# Patient Record
Sex: Female | Born: 1960 | Race: Black or African American | Hispanic: No | Marital: Married
Health system: Southern US, Community
[De-identification: ages and names within clinical notes are randomized; demographics above are authoritative.]

## PROBLEM LIST (undated history)

## (undated) DIAGNOSIS — I10 Essential (primary) hypertension: Secondary | ICD-10-CM

## (undated) HISTORY — PX: LAPAROSCOPIC GASTRIC SLEEVE RESECTION: SHX5895

---

## 2012-05-02 DIAGNOSIS — R739 Hyperglycemia, unspecified: Secondary | ICD-10-CM | POA: Insufficient documentation

## 2012-06-01 DIAGNOSIS — Z4651 Encounter for fitting and adjustment of gastric lap band: Secondary | ICD-10-CM | POA: Insufficient documentation

## 2014-03-27 DIAGNOSIS — I10 Essential (primary) hypertension: Secondary | ICD-10-CM | POA: Insufficient documentation

## 2014-06-21 DIAGNOSIS — R7303 Prediabetes: Secondary | ICD-10-CM | POA: Insufficient documentation

## 2016-03-13 DIAGNOSIS — F329 Major depressive disorder, single episode, unspecified: Secondary | ICD-10-CM | POA: Insufficient documentation

## 2017-08-15 DIAGNOSIS — R1031 Right lower quadrant pain: Secondary | ICD-10-CM | POA: Insufficient documentation

## 2017-08-15 DIAGNOSIS — T859XXA Unspecified complication of internal prosthetic device, implant and graft, initial encounter: Secondary | ICD-10-CM | POA: Insufficient documentation

## 2017-08-16 DIAGNOSIS — Z9884 Bariatric surgery status: Secondary | ICD-10-CM | POA: Insufficient documentation

## 2019-05-30 DIAGNOSIS — Z9884 Bariatric surgery status: Secondary | ICD-10-CM | POA: Insufficient documentation

## 2019-06-22 DIAGNOSIS — Z6841 Body Mass Index (BMI) 40.0 and over, adult: Secondary | ICD-10-CM | POA: Insufficient documentation

## 2019-07-23 DIAGNOSIS — R0989 Other specified symptoms and signs involving the circulatory and respiratory systems: Secondary | ICD-10-CM | POA: Insufficient documentation

## 2020-05-01 DIAGNOSIS — K219 Gastro-esophageal reflux disease without esophagitis: Secondary | ICD-10-CM | POA: Insufficient documentation

## 2021-05-25 ENCOUNTER — Ambulatory Visit: Payer: BC Managed Care – PPO | Admitting: Podiatry

## 2021-05-25 ENCOUNTER — Ambulatory Visit (INDEPENDENT_AMBULATORY_CARE_PROVIDER_SITE_OTHER): Payer: BC Managed Care – PPO

## 2021-05-25 ENCOUNTER — Encounter: Payer: Self-pay | Admitting: Podiatry

## 2021-05-25 ENCOUNTER — Other Ambulatory Visit: Payer: Self-pay

## 2021-05-25 DIAGNOSIS — M722 Plantar fascial fibromatosis: Secondary | ICD-10-CM | POA: Diagnosis not present

## 2021-05-25 DIAGNOSIS — M7732 Calcaneal spur, left foot: Secondary | ICD-10-CM | POA: Diagnosis not present

## 2021-05-25 MED ORDER — TRIAMCINOLONE ACETONIDE 10 MG/ML IJ SUSP
10.0000 mg | Freq: Once | INTRAMUSCULAR | Status: AC
  Administered 2021-05-25: 10 mg

## 2021-05-25 NOTE — Patient Instructions (Signed)

## 2021-05-27 NOTE — Progress Notes (Signed)
Subjective:   Patient ID: Jaclyn Hester, female   DOB: 60 y.o.   MRN: [id number]   HPI 60 year old female presents the office today for concerns of left heel pain.  She states it hurts from when she first gets up.  Dyspnea on the last 2 weeks is getting worse.  Has been on it for greater than 10 years but seems to be getting worse gradually.  She states that she would have flat shoes she has discomfort which she wears a shoe with a bit of a heel feels better.  She denies recent injury or trauma.  No rating pain or weakness.  No other concerns.   Review of Systems  All other systems reviewed and are negative.  History reviewed. No pertinent past medical history.  History reviewed. No pertinent surgical history.   Current Outpatient Medications:    Cholecalciferol 50 MCG (2000 UT) TABS, Take by mouth., Disp: , Rfl:    acetaminophen (TYLENOL) 650 MG CR tablet, Take by mouth., Disp: , Rfl:    amLODipine (NORVASC) 5 MG tablet, Take 1 tablet by mouth daily., Disp: , Rfl:    ascorbic acid (VITAMIN C) 1000 MG tablet, Take by mouth., Disp: , Rfl:    atenolol (TENORMIN) 25 MG tablet, Take 25 mg by mouth daily., Disp: , Rfl:    BINAXNOW COVID-19 AG HOME TEST KIT, See admin instructions., Disp: , Rfl:    Calcium Carbonate-Vitamin D (OYSTER SHELL CALCIUM/D) 250-125 MG-UNIT TABS, Take 1 tablet by mouth daily., Disp: , Rfl:    DULoxetine (CYMBALTA) 20 MG capsule, Take 20 mg by mouth daily., Disp: , Rfl:    hydrochlorothiazide (HYDRODIURIL) 12.5 MG tablet, Take 12.5 mg by mouth daily., Disp: , Rfl:    Multiple Vitamins-Minerals (MULTIVITAMIN WITH MINERALS) tablet, Take 1 tablet by mouth daily., Disp: , Rfl:   Allergies  Allergen Reactions   Ace Inhibitors Swelling   Erythromycin           Objective:  Physical Exam  General: AAO x3, NAD  Dermatological: Skin is warm, dry and supple bilateral.  There are no open sores, no preulcerative lesions, no rash or signs of infection  present.  Vascular: Dorsalis Pedis artery and Posterior Tibial artery pedal pulses are 2/4 bilateral with immedate capillary fill time. There is no pain with calf compression, swelling, warmth, erythema.   Neruologic: Grossly intact via light touch bilateral.  Negative Tinel sign.  Musculoskeletal: Tenderness to palpation along the plantar medial tubercle of the calcaneus at the insertion of plantar fascia on the left foot. There is no pain along the course of the plantar fascia within the arch of the foot. Plantar fascia appears to be intact. There is no pain with lateral compression of the calcaneus or pain with vibratory sensation. There is no pain along the course or insertion of the achilles tendon. No other areas of tenderness to bilateral lower extremities.  Gait: Unassisted, Nonantalgic.       Assessment:   Left foot pain, plan fasciitis     Plan:  -Treatment options discussed including all alternatives, risks, and complications -Etiology of symptoms were discussed -X-rays were obtained and reviewed with the patient.  There is no evidence of acute fracture or stress fracture identified today. -Steroid injection performed.  See procedure note below. -Plantar fascial brace dispensed.  -Heel lift -Discussion modifications and arch supports -Stretching/icing daily  Procedure: Injection Tendon/Ligament Discussed alternatives, risks, complications and verbal consent was obtained.  Location: Left plantar fascia at the glabrous junction;  medial approach. Skin Prep: Alcohol. Injectate: 0.5cc 0.5% marcaine plain, 0.5 cc 2% lidocaine plain and, 1 cc kenalog 10. Disposition: Patient tolerated procedure well. Injection site dressed with a band-aid.  Post-injection care was discussed and return precautions discussed.   Return in about 6 weeks (around 07/06/2021), or if symptoms worsen or fail to improve.  Trula Slade DPM

## 2021-07-02 ENCOUNTER — Ambulatory Visit: Payer: BC Managed Care – PPO | Admitting: Podiatry

## 2021-08-06 ENCOUNTER — Emergency Department (HOSPITAL_BASED_OUTPATIENT_CLINIC_OR_DEPARTMENT_OTHER): Payer: BC Managed Care – PPO

## 2021-08-06 ENCOUNTER — Other Ambulatory Visit: Payer: Self-pay

## 2021-08-06 ENCOUNTER — Emergency Department (HOSPITAL_BASED_OUTPATIENT_CLINIC_OR_DEPARTMENT_OTHER)
Admission: EM | Admit: 2021-08-06 | Discharge: 2021-08-06 | Disposition: A | Discharge status: Home / Self Care | Payer: BC Managed Care – PPO | Attending: Emergency Medicine | Admitting: Emergency Medicine

## 2021-08-06 DIAGNOSIS — M79605 Pain in left leg: Secondary | ICD-10-CM | POA: Diagnosis present

## 2021-08-06 DIAGNOSIS — M25562 Pain in left knee: Secondary | ICD-10-CM | POA: Diagnosis not present

## 2021-08-06 DIAGNOSIS — R52 Pain, unspecified: Secondary | ICD-10-CM

## 2021-08-06 DIAGNOSIS — I10 Essential (primary) hypertension: Secondary | ICD-10-CM | POA: Diagnosis not present

## 2021-08-06 MED ORDER — ACETAMINOPHEN 500 MG PO TABS
1000.0000 mg | ORAL_TABLET | Freq: Once | ORAL | Status: DC
  Filled 2021-08-06: qty 2

## 2021-08-06 NOTE — ED Triage Notes (Signed)
Pt presents to ED Pov. Pt c/o L leg pain that began Sunday. Pt reports that it started at knee and has now gone down to calf. No injury reported, no hx of blood clots.

## 2021-08-06 NOTE — Discharge Instructions (Addendum)
There is no evidence of blood clot in your left lower extremity.  Your x-ray of your left knee did reveal degenerative changes in the knee joint which may warrant follow-up with a specialist.  Recommend you follow-up with your primary care physician for reassessment. Recommend Ibuprofen for pain control

## 2021-08-06 NOTE — ED Notes (Signed)
Patient transported to ultrasound.

## 2021-08-06 NOTE — ED Notes (Signed)
Pt reports she has not been taking her hctz as directed because "it makes me go to the bathroom too much."

## 2021-08-06 NOTE — ED Notes (Signed)
Dc instructions reviewed with patient and patient stated understanding.

## 2021-08-06 NOTE — ED Notes (Signed)
Pt declines any  Need for pain med at this time.

## 2021-08-06 NOTE — ED Provider Notes (Signed)
[facility] Provider Note   CSN: [id number] Arrival date & time: 08/06/21  1307     History Chief Complaint  Patient presents with   Leg Pain    Jaclyn Hester is a 60 y.o. female.   Leg Pain Associated symptoms: no back pain and no fever    60 year old female with a medical history significant for obesity presenting to the emergency department with left leg pain.  She states that she has had some left knee pain with increasing pain with ambulation.  There is pain with range of motion of the left knee.  No fevers or chills.  She endorses mild swelling in her left lower extremity.  She is worried she may have a blood clot.  She denies any chest pain or shortness of breath.  No reported injury to the knee that she can think of.  No past medical history on file.  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   OB History   No obstetric history on file.     No family history on file.     Home Medications Prior to Admission medications   Medication Sig Start Date End Date Taking? Authorizing Provider  amLODipine (NORVASC) 5 MG tablet Take 1 tablet by mouth daily. 04/11/21  Yes [provider]  ascorbic acid (VITAMIN C) 1000 MG tablet Take by mouth.   Yes [provider]  atenolol  (TENORMIN) 25 MG tablet Take 25 mg by mouth daily. 04/11/21  Yes [provider]  Calcium Carbonate-Vitamin D (OYSTER SHELL CALCIUM/D) 250-125 MG-UNIT TABS Take 1 tablet by mouth daily.   Yes [provider]  Cholecalciferol 50 MCG (2000 UT) TABS Take by mouth. 06/23/18  Yes [provider]  DULoxetine (CYMBALTA) 20 MG capsule Take 20 mg by mouth daily. 04/11/21  Yes [provider]  hydrochlorothiazide (HYDRODIURIL) 12.5 MG tablet Take 12.5 mg by mouth daily. 04/11/21  Yes [provider]  Multiple Vitamins-Minerals (MULTIVITAMIN WITH MINERALS) tablet Take 1 tablet by mouth daily.   Yes [provider]  acetaminophen (TYLENOL) 650 MG CR tablet Take by mouth.    [provider]  Bartolo Darter COVID-19 AG HOME TEST KIT See admin instructions. 02/02/21   [provider]    Allergies    Ace inhibitors, Erythromycin, and Nsaids  Review of Systems   Review of Systems  Constitutional:  Negative for chills and fever.  HENT:  Negative for ear pain and sore throat.   Eyes:  Negative for pain and visual disturbance.  Respiratory:  Negative for cough and shortness of breath.   Cardiovascular:  Positive for leg swelling. Negative for chest pain and palpitations.  Gastrointestinal:  Negative for abdominal pain and vomiting.  Genitourinary:  Negative for dysuria and hematuria.  Musculoskeletal:  Positive for arthralgias. Negative for back pain.  Skin:  Negative for color change and rash.  Neurological:  Negative for seizures and syncope.  All other systems reviewed and are negative.  Physical Exam Updated Vital Signs BP (!) 148/81 (BP Location: Right Arm)   Pulse (!) 57   Temp 98.6 F (37 C) (Oral)   Resp 16   Ht $R'5\' 8"'Ys$  (1.727 m)   Wt 131.5 kg   SpO2 100%   BMI 44.09 kg/m   Physical Exam Vitals and nursing note reviewed.  Constitutional:      General: She is not in acute distress.    Appearance: She is well-developed.  HENT:      Head: Normocephalic and atraumatic.  Eyes:     Conjunctiva/sclera: Conjunctivae normal.  Cardiovascular:     Rate and Rhythm: Normal rate and regular rhythm.     Heart sounds: No murmur heard. Pulmonary:     Effort: Pulmonary effort is normal. No respiratory distress.     Breath sounds: Normal breath sounds.  Abdominal:     Palpations: Abdomen is soft.     Tenderness: There is no abdominal tenderness.  Musculoskeletal:        General: Tenderness present. No swelling, deformity or signs of injury.     Cervical back: Neck supple.     Comments: Pain on passive ROM of the left knee, no palpable joint effusion, no erythema or warmth  Skin:    General: Skin is warm and dry.  Neurological:     Mental Status: She is alert.    ED Results / Procedures / Treatments   Labs (all labs ordered are listed, but only abnormal results are displayed) Labs Reviewed - No data to display  EKG None  Radiology US Venous Img Lower Unilateral Left  Result Date: 08/06/2021 CLINICAL DATA:  Left leg swelling EXAM: LEFT LOWER EXTREMITY VENOUS DOPPLER ULTRASOUND TECHNIQUE: Gray-scale sonography with graded compression, as well as color Doppler and duplex ultrasound were performed to evaluate the lower extremity deep venous systems from the level of the common femoral vein and including the common femoral, femoral, profunda femoral, popliteal and calf veins including the posterior tibial, peroneal and gastrocnemius veins when visible. The superficial great saphenous vein was also interrogated. Spectral Doppler was utilized to evaluate flow at rest and with distal augmentation maneuvers in the common femoral, femoral and popliteal veins. COMPARISON:  None. FINDINGS: Contralateral Common Femoral Vein: Respiratory phasicity is normal and symmetric with the symptomatic side. No evidence of thrombus. Normal compressibility. Common Femoral Vein: No evidence of thrombus. Normal compressibility, respiratory phasicity  and response to augmentation. Saphenofemoral Junction: No evidence of thrombus. Normal compressibility and flow on color Doppler imaging. Profunda Femoral Vein: No evidence of thrombus. Normal compressibility and flow on color Doppler imaging. Femoral Vein: No evidence of thrombus. Normal compressibility, respiratory phasicity and response to augmentation. Popliteal Vein: No evidence of thrombus. Normal compressibility, respiratory phasicity and response to augmentation. Calf Veins: No evidence of thrombus. Normal compressibility and flow on color Doppler imaging. Other Findings: The left GSV is noted to be patent throughout the left lower extremity. IMPRESSION: No evidence of deep venous thrombosis. Electronically Signed   By: Albin Felling M.D.   On: 08/06/2021 15:21   DG Knee Left Port  Result Date: 08/06/2021 CLINICAL DATA:  Anterior leg pain EXAM: PORTABLE LEFT KNEE - 1-2 VIEW COMPARISON:  None. FINDINGS: No evidence of fracture, dislocation, or joint effusion. Moderate tricompartmental degenerative changes, most severe at the medial and patellofemoral compartments. Soft tissues are unremarkable. IMPRESSION: Moderate tricompartmental degenerative changes, most severe at the  medial and patellofemoral compartments. Electronically Signed   By: Yetta Glassman M.D.   On: 08/06/2021 13:56    Procedures Procedures   Medications Ordered in ED Medications - No data to display  ED Course  I have reviewed the triage vital signs and the nursing notes.  Pertinent labs & imaging results that were available during my care of the patient were reviewed by me and considered in my medical decision making (see chart for details).    MDM Rules/Calculators/A&P                           60 year old female presenting to the emergency department with left knee pain and left lower extremity swelling.  Patient concern for possible DVT.  Left lower extremity DVT ultrasound performed and resulted negative for DVT.   X-ray imaging was concerning for severe degenerative changes of the left knee.  Symptoms are consistent with likely osteoarthritis.  No erythema or warmth or joint swelling appreciable to indicate gout or septic arthritis.  Low concern for those etiologies at this time.  Patient was advised NSAIDs for pain control and follow-up with her PCP. Final Clinical Impression(s) / ED Diagnoses Final diagnoses:  Pain  Left knee pain, unspecified chronicity    Rx / DC Orders ED Discharge Orders     None        Regan Lemming, MD 08/06/21 2155

## 2022-06-01 IMAGING — US US EXTREM LOW VENOUS*L*
1 series · 13 of 24 positions shown · non-contrast
Comparison: None.

CLINICAL DATA: Left leg swelling



[Series 1: us venous img lower uni left (dvt) · portal-venous · 13 of 44 slices shown]
[im 1/44]
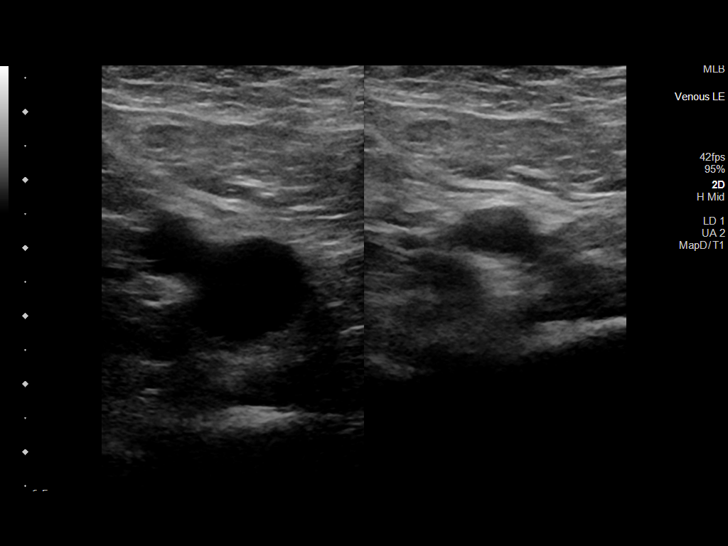
[im 4/44]
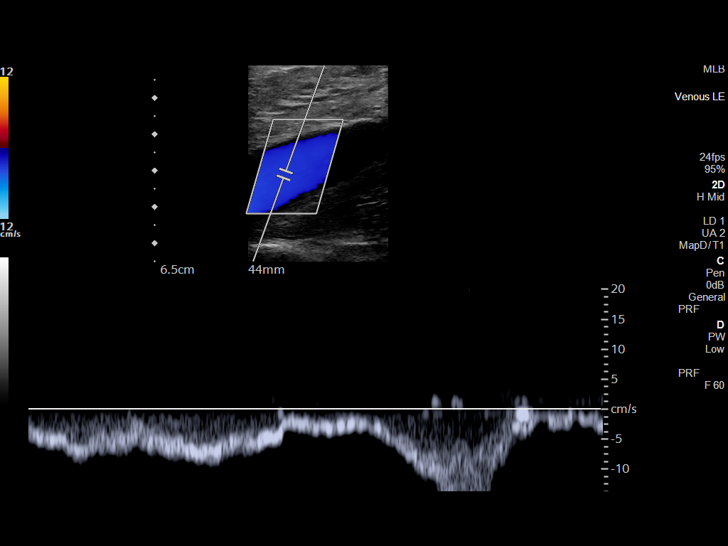
[im 8/44]
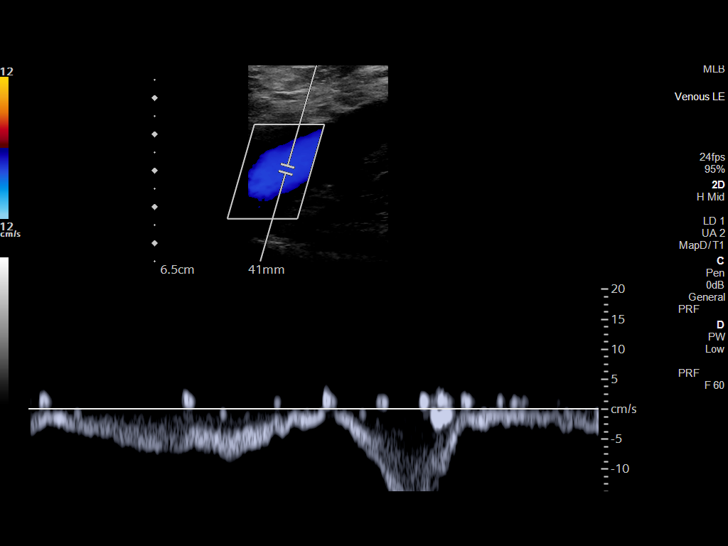
[im 12/44]
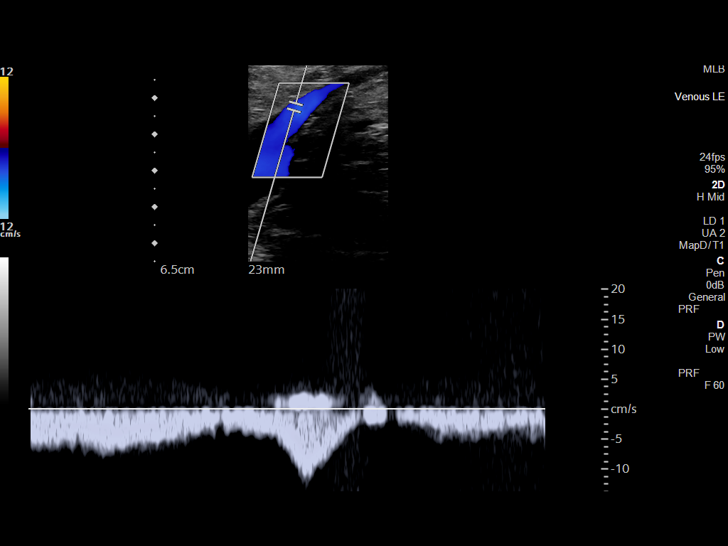
[im 15/44]
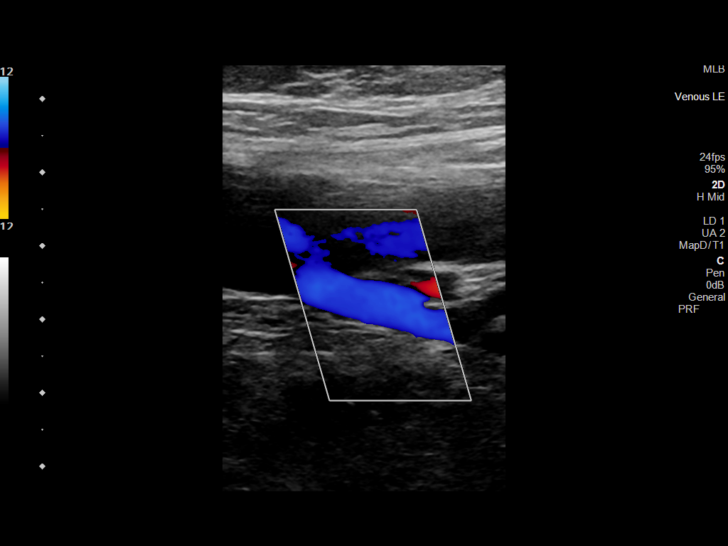
[im 19/44]
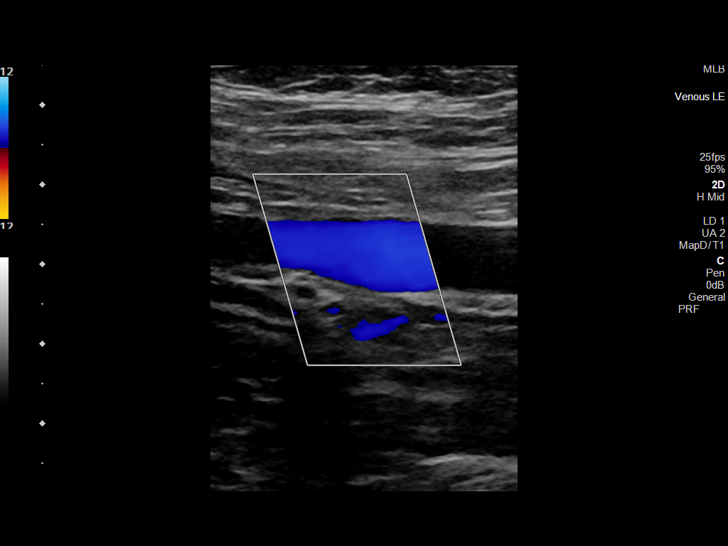
[im 23/44]
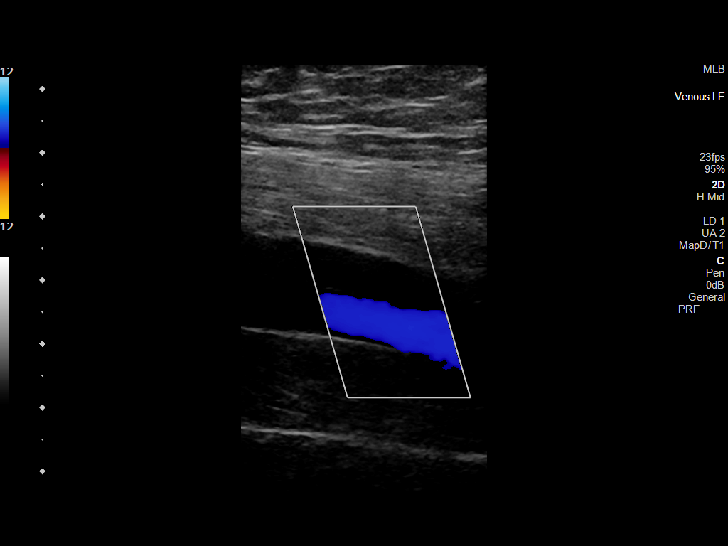
[im 25/44]
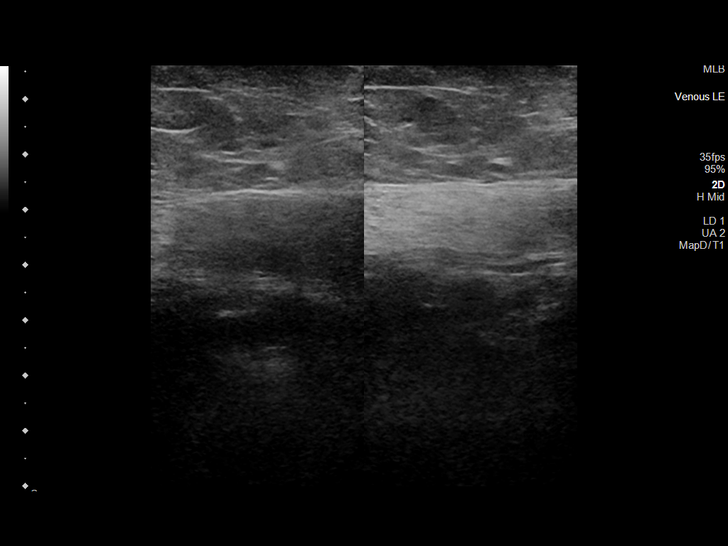
[im 29/44]
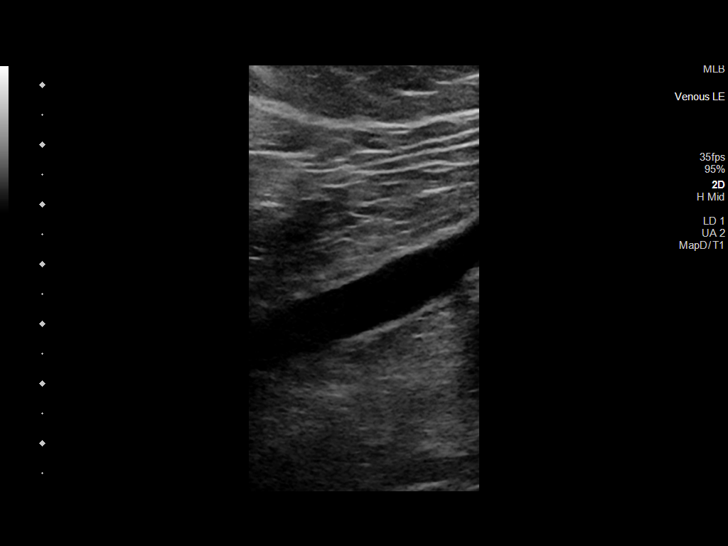
[im 32/44]
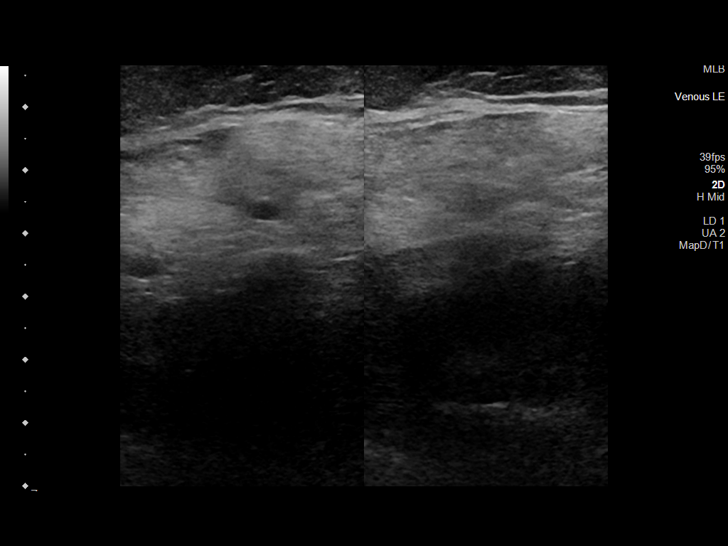
[im 36/44]
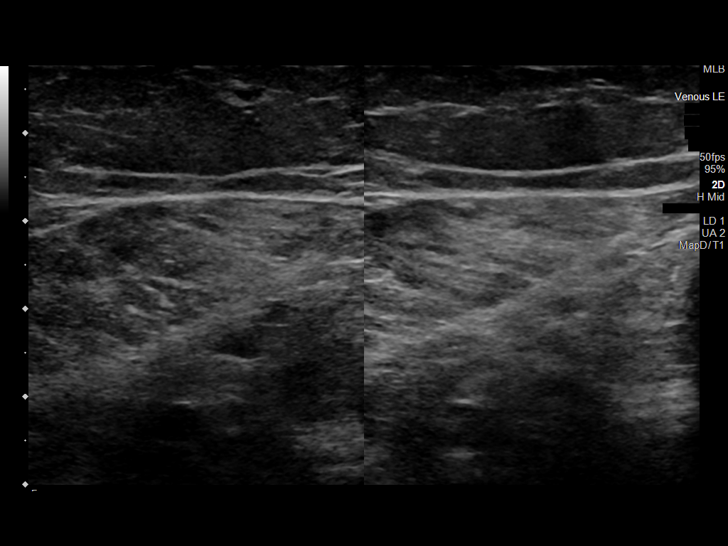
[im 40/44]
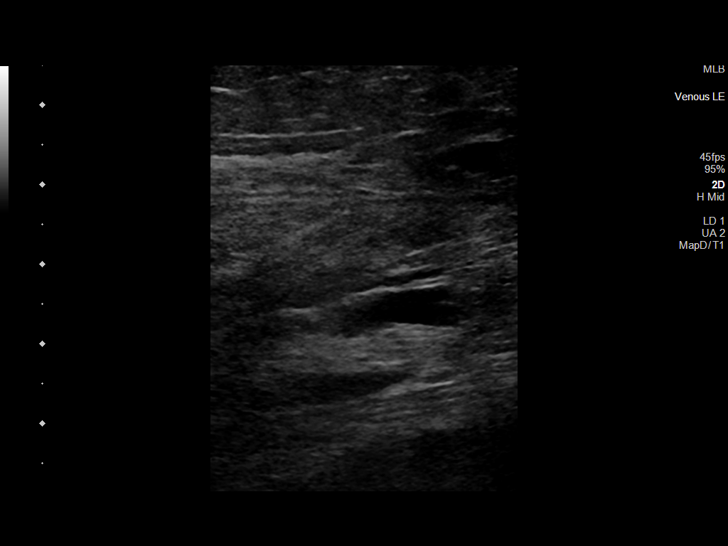
[im 44/44]
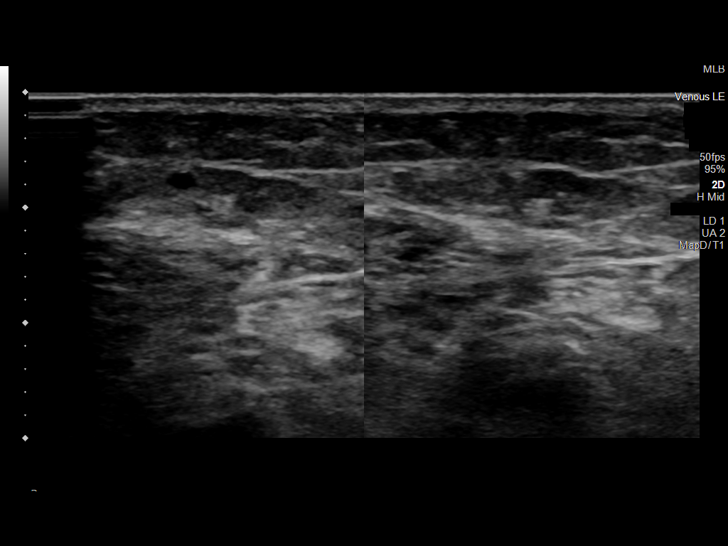

[13 of 24 positions shown; findings below may reference images not displayed]

FINDINGS: Contralateral Common Femoral Vein: Respiratory phasicity is normal
and symmetric with the symptomatic side. No evidence of thrombus.
Normal compressibility.

Common Femoral Vein: No evidence of thrombus. Normal
compressibility, respiratory phasicity and response to augmentation.

Saphenofemoral Junction: No evidence of thrombus. Normal
compressibility and flow on color Doppler imaging.

Profunda Femoral Vein: No evidence of thrombus. Normal
compressibility and flow on color Doppler imaging.

Femoral Vein: No evidence of thrombus. Normal compressibility,
respiratory phasicity and response to augmentation.

Popliteal Vein: No evidence of thrombus. Normal compressibility,
respiratory phasicity and response to augmentation.

Calf Veins: No evidence of thrombus. Normal compressibility and flow
on color Doppler imaging.

Other Findings: The left GSV is noted to be patent throughout the
left lower extremity.
IMPRESSION: No evidence of deep venous thrombosis.

## 2024-06-09 ENCOUNTER — Ambulatory Visit
Admission: RE | Admit: 2024-06-09 | Discharge: 2024-06-09 | Disposition: A | Discharge status: Home / Self Care | Payer: Self-pay | Source: Ambulatory Visit | Attending: Physician Assistant | Admitting: Physician Assistant

## 2024-06-09 ENCOUNTER — Other Ambulatory Visit: Payer: Self-pay

## 2024-06-09 VITALS — BP 119/83 | HR 94 | Temp 98.9°F | Resp 18

## 2024-06-09 DIAGNOSIS — M545 Low back pain, unspecified: Secondary | ICD-10-CM

## 2024-06-09 HISTORY — DX: Essential (primary) hypertension: I10

## 2024-06-09 MED ORDER — CYCLOBENZAPRINE HCL 10 MG PO TABS: 10.0000 mg | ORAL_TABLET | Freq: Two times a day (BID) | ORAL | 0 refills | Status: AC | PRN

## 2024-06-09 MED ORDER — PREDNISONE 20 MG PO TABS: 40.0000 mg | ORAL_TABLET | Freq: Every day | ORAL | 0 refills | Status: AC

## 2024-06-09 NOTE — ED Triage Notes (Addendum)
 Sciatica pain - Entered by patient  States she had to sit in meetings all last week and thinks this may have aggravated it. States I haven't had any problems in 10 years. Using tylenol  and OTC pain patches with some relief. Pain worse with walking down steps, feels it more on right side.

## 2024-06-09 NOTE — ED Provider Notes (Signed)
 [facility]    CSN: [id number] Arrival date & time: 06/09/24  0854      History   Chief Complaint Chief Complaint  Patient presents with  . Back Pain    Sciatica pain - Entered by patient    HPI Jaclyn Hester is a 63 y.o. female.    Back Pain Associated symptoms: no abdominal pain and no fever     No past medical history on file.  Patient Active Problem List   Diagnosis Date Noted  . Gastroesophageal reflux disease without esophagitis 05/01/2020  . Labile hypertension 07/23/2019  . Morbid obesity with BMI of 40.0-44.9, adult ([facility]) 06/22/2019  . 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 History of removal of laparoscopic gastric banding device 08/16/2017  . Device, implant, or graft complication 08/15/2017  . RLQ abdominal pain 08/15/2017  . Major depressive disorder 03/13/2016  . Pre-diabetes 06/21/2014  . HTN (hypertension) 03/27/2014  . Fitting and adjustment of gastric lap band 06/01/2012  . Hyperglycemia 05/02/2012  . Vertigo 05/02/2012  . Vitamin D deficiency 05/02/2012    No past surgical history on file.  OB History   No obstetric history on file.      Home Medications    Prior to Admission medications   Medication Sig Start Date End Date Taking? Authorizing Provider  acetaminophen  (TYLENOL ) 650 MG CR tablet Take by mouth.    [provider]  amLODipine (NORVASC) 5 MG tablet Take 1 tablet by mouth daily. 04/11/21   [provider]  ascorbic acid (VITAMIN C) 1000 MG tablet Take by mouth.    [provider]  atenolol (TENORMIN) 25 MG tablet Take 25 mg by mouth daily. 04/11/21   [provider]  ARLEENE COVID-19 AG HOME TEST KIT See admin instructions. 02/02/21   [provider]  Calcium Carbonate-Vitamin D (OYSTER SHELL CALCIUM/D) 250-125 MG-UNIT TABS Take 1 tablet by mouth daily.    [provider]  Cholecalciferol 50 MCG (2000 UT) TABS Take by mouth. 06/23/18    [provider]  DULoxetine (CYMBALTA) 20 MG capsule Take 20 mg by mouth daily. 04/11/21   [provider]  hydrochlorothiazide (HYDRODIURIL) 12.5 MG tablet Take 12.5 mg by mouth daily. 04/11/21   [provider]  Multiple Vitamins-Minerals (MULTIVITAMIN WITH MINERALS) tablet Take 1 tablet by mouth daily.    [provider]    Family History No family history on file.  Social History     Allergies   Ace inhibitors, Erythromycin, and Nsaids   Review of Systems Review of Systems  Constitutional:  Negative for chills and fever.  Eyes:  Negative for discharge and redness.  Respiratory:  Negative for shortness of breath.   Gastrointestinal:  Negative for abdominal pain, nausea and vomiting.  Musculoskeletal:  Positive for back pain and myalgias.     Physical Exam Triage Vital Signs ED Triage Vitals  Encounter Vitals Group     BP      Girls Systolic BP Percentile      Girls Diastolic BP Percentile      Boys Systolic BP Percentile      Boys Diastolic BP Percentile      Pulse      Resp      Temp      Temp src      SpO2      Weight      Height      Head Circumference      Peak Flow  Pain Score      Pain Loc      Pain Education      Exclude from Growth Chart    No data found.  Updated Vital Signs There were no vitals taken for this visit.  Visual Acuity Right Eye Distance:   Left Eye Distance:   Bilateral Distance:    Right Eye Near:   Left Eye Near:    Bilateral Near:     Physical Exam Vitals and nursing note reviewed.  Constitutional:      General: She is not in acute distress.    Appearance: Normal appearance. She is not ill-appearing.  HENT:     Head: Normocephalic and atraumatic.   Eyes:     Conjunctiva/sclera: Conjunctivae normal.    Cardiovascular:     Rate and Rhythm: Normal rate.  Pulmonary:     Effort: Pulmonary effort is normal.   Neurological:     Mental Status: She is alert.   Psychiatric:         Mood and Affect: Mood normal.        Behavior: Behavior normal.        Thought Content: Thought content normal.     UC Treatments / Results  Labs (all labs ordered are listed, but only abnormal results are displayed) Labs Reviewed - No data to display  EKG   Radiology No results found.  Procedures Procedures (including critical care time)  Medications Ordered in UC Medications - No data to display  Initial Impression / Assessment and Plan / UC Course  I have reviewed the triage vital signs and the nursing notes.  Pertinent labs & imaging results that were available during my care of the patient were reviewed by me and considered in my medical decision making (see chart for details).     *** Final Clinical Impressions(s) / UC Diagnoses   Final diagnoses:  None   Discharge Instructions   None    ED Prescriptions   None    PDMP not reviewed this encounter.
